# Patient Record
Sex: Female | Born: 1995 | Race: Black or African American | Hispanic: No | Marital: Single | Smoking: Never smoker
Health system: Southern US, Community
[De-identification: ages and names within clinical notes are randomized; demographics above are authoritative.]

---

## 2018-07-23 ENCOUNTER — Encounter (HOSPITAL_BASED_OUTPATIENT_CLINIC_OR_DEPARTMENT_OTHER): Payer: Self-pay | Admitting: Emergency Medicine

## 2018-07-23 ENCOUNTER — Emergency Department (HOSPITAL_BASED_OUTPATIENT_CLINIC_OR_DEPARTMENT_OTHER): Payer: No Typology Code available for payment source

## 2018-07-23 ENCOUNTER — Emergency Department (HOSPITAL_BASED_OUTPATIENT_CLINIC_OR_DEPARTMENT_OTHER)
Admission: EM | Admit: 2018-07-23 | Discharge: 2018-07-23 | Disposition: A | Discharge status: Home / Self Care | Payer: No Typology Code available for payment source | Attending: Emergency Medicine | Admitting: Emergency Medicine

## 2018-07-23 ENCOUNTER — Other Ambulatory Visit: Payer: Self-pay

## 2018-07-23 DIAGNOSIS — Y9389 Activity, other specified: Secondary | ICD-10-CM | POA: Diagnosis not present

## 2018-07-23 DIAGNOSIS — S134XXA Sprain of ligaments of cervical spine, initial encounter: Secondary | ICD-10-CM | POA: Diagnosis not present

## 2018-07-23 DIAGNOSIS — S0083XA Contusion of other part of head, initial encounter: Secondary | ICD-10-CM | POA: Insufficient documentation

## 2018-07-23 DIAGNOSIS — Y92410 Unspecified street and highway as the place of occurrence of the external cause: Secondary | ICD-10-CM | POA: Insufficient documentation

## 2018-07-23 DIAGNOSIS — S9031XA Contusion of right foot, initial encounter: Secondary | ICD-10-CM | POA: Diagnosis not present

## 2018-07-23 DIAGNOSIS — Y998 Other external cause status: Secondary | ICD-10-CM | POA: Insufficient documentation

## 2018-07-23 DIAGNOSIS — S0993XA Unspecified injury of face, initial encounter: Secondary | ICD-10-CM | POA: Diagnosis present

## 2018-07-23 DIAGNOSIS — S139XXA Sprain of joints and ligaments of unspecified parts of neck, initial encounter: Secondary | ICD-10-CM

## 2018-07-23 LAB — PREGNANCY, URINE: PREG TEST UR: NEGATIVE

## 2018-07-23 MED ORDER — IBUPROFEN 400 MG PO TABS
400.0000 mg | ORAL_TABLET | Freq: Once | ORAL | Status: AC
  Administered 2018-07-23: 400 mg via ORAL
  Filled 2018-07-23: qty 1
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
## 2018-07-23 NOTE — ED Notes (Signed)
Patient transported to CT/radiology. 
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
